# Patient Record
Sex: Male | Born: 1975 | Race: White | Hispanic: No | Marital: Single | Smoking: Current every day smoker
Health system: Southern US, Community
[De-identification: ages and names within clinical notes are randomized; demographics above are authoritative.]

---
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
## 2004-02-18 ENCOUNTER — Emergency Department (HOSPITAL_COMMUNITY): Admission: EM | Admit: 2004-02-18 | Discharge: 2004-02-19 | Payer: Self-pay | Admitting: Emergency Medicine

## 2005-01-24 ENCOUNTER — Emergency Department (HOSPITAL_COMMUNITY): Admission: EM | Admit: 2005-01-24 | Discharge: 2005-01-24 | Payer: Self-pay | Admitting: Emergency Medicine

## 2014-02-11 ENCOUNTER — Encounter (HOSPITAL_COMMUNITY): Payer: Self-pay | Admitting: Emergency Medicine

## 2014-02-11 ENCOUNTER — Emergency Department (HOSPITAL_COMMUNITY)
Admission: EM | Admit: 2014-02-11 | Discharge: 2014-02-12 | Disposition: A | Discharge status: Home / Self Care | Payer: BC Managed Care – PPO | Attending: Dermatology | Admitting: Dermatology

## 2014-02-11 ENCOUNTER — Emergency Department (HOSPITAL_COMMUNITY): Payer: BC Managed Care – PPO

## 2014-02-11 DIAGNOSIS — F17200 Nicotine dependence, unspecified, uncomplicated: Secondary | ICD-10-CM | POA: Insufficient documentation

## 2014-02-11 DIAGNOSIS — R1013 Epigastric pain: Secondary | ICD-10-CM | POA: Insufficient documentation

## 2014-02-11 DIAGNOSIS — R109 Unspecified abdominal pain: Secondary | ICD-10-CM

## 2014-02-11 LAB — COMPREHENSIVE METABOLIC PANEL
ALBUMIN: 4.3 g/dL (ref 3.5–5.2)
ALT: 12 U/L (ref 0–53)
AST: 16 U/L (ref 0–37)
Alkaline Phosphatase: 71 U/L (ref 39–117)
BILIRUBIN TOTAL: 0.5 mg/dL (ref 0.3–1.2)
BUN: 9 mg/dL (ref 6–23)
CALCIUM: 9.8 mg/dL (ref 8.4–10.5)
CO2: 20 meq/L (ref 19–32)
CREATININE: 0.83 mg/dL (ref 0.50–1.35)
Chloride: 101 mEq/L (ref 96–112)
GFR calc Af Amer: >90 mL/min (ref >90)
GFR calc non Af Amer: >90 mL/min (ref >90)
GLUCOSE: 110 mg/dL — AB (ref 70–99)
POTASSIUM: 3.8 meq/L (ref 3.7–5.3)
Sodium: 137 mEq/L (ref 137–147)
TOTAL PROTEIN: 7.8 g/dL (ref 6.0–8.3)

## 2014-02-11 LAB — CBC
HCT: 41.8 % (ref 39.0–52.0)
Hemoglobin: 15.3 g/dL (ref 13.0–17.0)
MCH: 33.3 pg (ref 26.0–34.0)
MCHC: 36.6 g/dL — ABNORMAL HIGH (ref 30.0–36.0)
MCV: 90.9 fL (ref 78.0–100.0)
Platelets: 313 10*3/uL (ref 150–400)
RBC: 4.6 MIL/uL (ref 4.22–5.81)
RDW: 12.7 % (ref 11.5–15.5)
WBC: 21.6 10*3/uL — AB (ref 4.0–10.5)

## 2014-02-11 LAB — LIPASE, BLOOD: Lipase: 18 U/L (ref 11–59)

## 2014-02-11 MED ORDER — IOHEXOL 300 MG/ML  SOLN
100.0000 mL | Freq: Once | INTRAMUSCULAR | Status: AC | PRN
  Administered 2014-02-11: 100 mL via INTRAVENOUS

## 2014-02-11 MED ORDER — PANTOPRAZOLE SODIUM 40 MG PO TBEC: 40.0000 mg | DELAYED_RELEASE_TABLET | Freq: Every day | ORAL | Status: AC

## 2014-02-11 MED ORDER — HYDROCODONE-ACETAMINOPHEN 5-325 MG PO TABS
2.0000 | ORAL_TABLET | Freq: Once | ORAL | Status: AC
  Administered 2014-02-11: 2 via ORAL
  Filled 2014-02-11: qty 2

## 2014-02-11 MED ORDER — GI COCKTAIL ~~LOC~~
30.0000 mL | Freq: Once | ORAL | Status: AC
  Administered 2014-02-11: 30 mL via ORAL
  Filled 2014-02-11: qty 30

## 2014-02-11 MED ORDER — IOHEXOL 300 MG/ML  SOLN
50.0000 mL | Freq: Once | INTRAMUSCULAR | Status: AC | PRN
  Administered 2014-02-11: 50 mL via ORAL

## 2014-02-11 MED ORDER — FAMOTIDINE 20 MG PO TABS
20.0000 mg | ORAL_TABLET | Freq: Once | ORAL | Status: AC
  Administered 2014-02-11: 20 mg via ORAL
  Filled 2014-02-11: qty 1

## 2014-02-11 NOTE — Discharge Instructions (Signed)
Rest. Drink plenty of fluids. Take protonix (acid blocker medication).   You may also try maalox or mylanta as need for symptom relief. Follow up with GI doctor in the coming week - see referral - call office tomorrow to arrange appointment. Return to ER if worse, new symptoms, fevers, persistent vomiting, severe pain, other concern.  You were given pain medication in the ER - no driving for the next 4 hours.      Abdominal Pain, Adult Many things can cause abdominal pain. Usually, abdominal pain is not caused by a disease and will improve without treatment. It can often be observed and treated at home. Your health care provider will do a physical exam and possibly order blood tests and X-rays to help determine the seriousness of your pain. However, in many cases, more time must pass before a clear cause of the pain can be found. Before that point, your health care provider may not know if you need more testing or further treatment. HOME CARE INSTRUCTIONS  Monitor your abdominal pain for any changes. The following actions may help to alleviate any discomfort you are experiencing:  Only take over-the-counter or prescription medicines as directed by your health care provider.  Do not take laxatives unless directed to do so by your health care provider.  Try a clear liquid diet (broth, tea, or water) as directed by your health care provider. Slowly move to a bland diet as tolerated. SEEK MEDICAL CARE IF:  You have unexplained abdominal pain.  You have abdominal pain associated with nausea or diarrhea.  You have pain when you urinate or have a bowel movement.  You experience abdominal pain that wakes you in the night.  You have abdominal pain that is worsened or improved by eating food.  You have abdominal pain that is worsened with eating fatty foods. SEEK IMMEDIATE MEDICAL CARE IF:   Your pain does not go away within 2 hours.  You have a fever.  You keep throwing up  (vomiting).  Your pain is felt only in portions of the abdomen, such as the right side or the left lower portion of the abdomen.  You pass bloody or black tarry stools. MAKE SURE YOU:  Understand these instructions.   Will watch your condition.   Will get help right away if you are not doing well or get worse.  Document Released: 08/09/2005 Document Revised: 08/20/2013 Document Reviewed: 07/09/2013 [facility] Patient Information 2014 [place], [place].    Gastritis, Adult Gastritis is soreness and swelling (inflammation) of the lining of the stomach. Gastritis can develop as a sudden onset (acute) or long-term (chronic) condition. If gastritis is not treated, it can lead to stomach bleeding and ulcers. CAUSES  Gastritis occurs when the stomach lining is weak or damaged. Digestive juices from the stomach then inflame the weakened stomach lining. The stomach lining may be weak or damaged due to viral or bacterial infections. One common bacterial infection is the Helicobacter pylori infection. Gastritis can also result from excessive alcohol consumption, taking certain medicines, or having too much acid in the stomach.  SYMPTOMS  In some cases, there are no symptoms. When symptoms are present, they may include:  Pain or a burning sensation in the upper abdomen.  Nausea.  Vomiting.  An uncomfortable feeling of fullness after eating. DIAGNOSIS  Your caregiver may suspect you have gastritis based on your symptoms and a physical exam. To determine the cause of your gastritis, your caregiver may perform the following:  Blood or  stool tests to check for the H pylori bacterium.  Gastroscopy. A thin, flexible tube (endoscope) is passed down the esophagus and into the stomach. The endoscope has a light and camera on the end. Your caregiver uses the endoscope to view the inside of the stomach.  Taking a tissue sample (biopsy) from the stomach to examine under a microscope. TREATMENT   Depending on the cause of your gastritis, medicines may be prescribed. If you have a bacterial infection, such as an H pylori infection, antibiotics may be given. If your gastritis is caused by too much acid in the stomach, H2 blockers or antacids may be given. Your caregiver may recommend that you stop taking aspirin, ibuprofen, or other nonsteroidal anti-inflammatory drugs (NSAIDs). HOME CARE INSTRUCTIONS  Only take over-the-counter or prescription medicines as directed by your caregiver.  If you were given antibiotic medicines, take them as directed. Finish them even if you start to feel better.  Drink enough fluids to keep your urine clear or pale yellow.  Avoid foods and drinks that make your symptoms worse, such as:  Caffeine or alcoholic drinks.  Chocolate.  Peppermint or mint flavorings.  Garlic and onions.  Spicy foods.  Citrus fruits, such as oranges, lemons, or limes.  Tomato-based foods such as sauce, chili, salsa, and pizza.  Fried and fatty foods.  Eat small, frequent meals instead of large meals. SEEK IMMEDIATE MEDICAL CARE IF:   You have black or dark red stools.  You vomit blood or material that looks like coffee grounds.  You are unable to keep fluids down.  Your abdominal pain gets worse.  You have a fever.  You do not feel better after 1 week.  You have any other questions or concerns. MAKE SURE YOU:  Understand these instructions.  Will watch your condition.  Will get help right away if you are not doing well or get worse. Document Released: 10/24/2001 Document Revised: 04/30/2012 Document Reviewed: 12/13/2011 [facility] Patient Information 2014 [place], [place].

## 2014-02-11 NOTE — ED Provider Notes (Signed)
CSN: [id number]     Arrival date & time 02/11/14  2152 History   First MD Initiated Contact with Patient 02/11/14 2213     Chief Complaint  Patient presents with  . Abdominal Pain     (Consider location/radiation/quality/duration/timing/severity/associated sxs/prior Treatment) Patient is a 38 y.o. male presenting with abdominal pain. The history is provided by the patient.  Abdominal Pain Associated symptoms: no chest pain, no chills, no constipation, no cough, no diarrhea, no fever, no shortness of breath, no sore throat and no vomiting   pt c/o epigastric pain, initially 1 month ago, states was seen by doctor then, and told possibly gastritis/pud, and was given gi meds w relief of symptoms for a couple days. States in past month since, return of similar pain. Epigastric area, dull, moderate, non radiating. States not clearly better or worse w eating. No back pain. No previous hx pud, pancreatitis or gallstones. No abd distension. No prior abd surgery. Continues to have normal bms including today. Nausea. No vomiting. Denies cp or discomfort. No sob. No cough or uri c/o. No fever or chills. No abd trauma/injury. No wt loss. Denies etoh abuse.      History reviewed. No pertinent past medical history. Past Surgical History  Procedure Laterality Date  . Knee surgery  1996   No family history on file. History  Substance Use Topics  . Smoking status: Current Every Day Smoker -- 1.00 packs/day    Types: Cigarettes  . Smokeless tobacco: Not on file  . Alcohol Use: 2.4 oz/week    4 Cans of beer per week    Review of Systems  Constitutional: Negative for fever and chills.  HENT: Negative for sore throat.   Eyes: Negative for redness.  Respiratory: Negative for cough and shortness of breath.   Cardiovascular: Negative for chest pain.  Gastrointestinal: Positive for abdominal pain. Negative for vomiting, diarrhea, constipation and abdominal distention.  Genitourinary: Negative for flank  pain.  Musculoskeletal: Negative for back pain and neck pain.  Skin: Negative for rash.  Neurological: Negative for headaches.  Hematological: Does not bruise/bleed easily.  Psychiatric/Behavioral: Negative for confusion.      Allergies  Review of patient's allergies indicates no known allergies.  Home Medications  No current outpatient prescriptions on file. BP 124/82  Pulse 77  Temp(Src) 98.6 F (37 C) (Oral)  Resp 22  Wt 135 lb (61.236 kg)  SpO2 98% Physical Exam  Nursing note and vitals reviewed. Constitutional: He is oriented to person, place, and time. He appears well-developed and well-nourished. No distress.  HENT:  Mouth/Throat: Oropharynx is clear and moist.  Eyes: Conjunctivae are normal. No scleral icterus.  Neck: Neck supple. No tracheal deviation present.  Cardiovascular: Normal rate, regular rhythm, normal heart sounds and intact distal pulses.  Exam reveals no gallop and no friction rub.   No murmur heard. Pulmonary/Chest: Effort normal and breath sounds normal. No accessory muscle usage. No respiratory distress.  Abdominal: Soft. Bowel sounds are normal. He exhibits no distension and no mass. There is tenderness. There is no rebound and no guarding.  Mild epigastric tenderness, no rebound or guarding.   Genitourinary:  No cva tenderness  Musculoskeletal: Normal range of motion. He exhibits no edema and no tenderness.  Neurological: He is alert and oriented to person, place, and time.  Skin: Skin is warm and dry. He is not diaphoretic.  Psychiatric: He has a normal mood and affect.    ED Course  Procedures (including critical care time)  Results for orders placed during the hospital encounter of 02/11/14  CBC      Result Value Ref Range   WBC 21.6 (*) 4.0 - 10.5 K/uL   RBC 4.60  4.22 - 5.81 MIL/uL   Hemoglobin 15.3  13.0 - 17.0 g/dL   HCT 16.141.8  09.639.0 - 04.552.0 %   MCV 90.9  78.0 - 100.0 fL   MCH 33.3  26.0 - 34.0 pg   MCHC 36.6 (*) 30.0 - 36.0 g/dL    RDW 40.912.7  81.111.5 - 91.415.5 %   Platelets 313  150 - 400 K/uL  COMPREHENSIVE METABOLIC PANEL      Result Value Ref Range   Sodium 137  137 - 147 mEq/L   Potassium 3.8  3.7 - 5.3 mEq/L   Chloride 101  96 - 112 mEq/L   CO2 20  19 - 32 mEq/L   Glucose, Bld 110 (*) 70 - 99 mg/dL   BUN 9  6 - 23 mg/dL   Creatinine, Ser 7.820.83  0.50 - 1.35 mg/dL   Calcium 9.8  8.4 - 95.610.5 mg/dL   Total Protein 7.8  6.0 - 8.3 g/dL   Albumin 4.3  3.5 - 5.2 g/dL   AST 16  0 - 37 U/L   ALT 12  0 - 53 U/L   Alkaline Phosphatase 71  39 - 117 U/L   Total Bilirubin 0.5  0.3 - 1.2 mg/dL   GFR calc non Af Amer >90  >90 mL/min   GFR calc Af Amer >90  >90 mL/min  LIPASE, BLOOD      Result Value Ref Range   Lipase 18  11 - 59 U/L       MDM  Labs. Pt states got ride to ED, does not have to drive home. Pepcid, gi cocktail and hydrocodone for symptom relief.   Reviewed nursing notes and prior charts for additional history.   Recheck pt - mid abd tenderness on exam. Wbc 21. Will get ct.   2330, ct pending - signed out to Dr Dierdre Highmanpitz to follow up w ct.  If negative, and pt improved, may d/c w acid blocker rx and gi follow up.  If positive/abn, dispo appropriately.      Suzi RootsKevin E Demarie Hyneman, MD 02/11/14 [phone]

## 2014-02-11 NOTE — ED Notes (Signed)
Per pt report: pt was seen at UC 2 weeks ago and tx stomach ulcers and hemmorrids. The medications did help for a few days but pain has come back and is worse than before.  Pt pain is in the right upper quadrant.  Pt reports having nausea. Pt a/o x 4. Skin warm and dry.  Pt ambulatory in triage.

## 2014-02-12 NOTE — ED Provider Notes (Signed)
12:59 AM PT evaluated - states he is feeling better, minimal discomfort after medications. ABD s/nt/nd, no acute ABD. Neg Murphys sign. Ct results shared with PT as below.   Plan d/c home, Rx PPI, GI referral, return precautions provided.    Ct Abdomen Pelvis W Contrast  02/12/2014   CLINICAL DATA:  Abdominal pain, weakness and nausea.  EXAM: CT ABDOMEN AND PELVIS WITH CONTRAST  TECHNIQUE: Multidetector CT imaging of the abdomen and pelvis was performed using the standard protocol following bolus administration of intravenous contrast.  CONTRAST:  50mL OMNIPAQUE IOHEXOL 300 MG/ML SOLN, 100mL OMNIPAQUE IOHEXOL 300 MG/ML SOLN  COMPARISON:  None.  FINDINGS: The visualized lung bases are clear.  The liver and spleen are unremarkable in appearance. The gallbladder is within normal limits. The pancreas and adrenal glands are unremarkable.  The kidneys are unremarkable in appearance. There is no evidence of hydronephrosis. No renal or ureteral stones are seen. No perinephric stranding is appreciated.  No free fluid is identified. The small bowel is unremarkable in appearance. The stomach is within normal limits. No acute vascular abnormalities are seen.  The appendix is not definitely seen; there is no evidence for appendicitis. The colon is unremarkable in appearance.  The bladder is moderately distended and grossly unremarkable in appearance. The prostate remains normal in size. No inguinal lymphadenopathy is seen.  No acute osseous abnormalities are identified.  IMPRESSION: Unremarkable contrast-enhanced CT of the abdomen and pelvis.   Electronically Signed   By: Roanna RaiderJeffery  Chang M.D.   On: 02/12/2014 00:05      Sunnie NielsenBrian Quinlynn Cuthbert, MD 02/12/14 0100

## 2014-02-12 NOTE — ED Notes (Signed)
Pt's ride is in the waiting room.

## 2014-02-19 ENCOUNTER — Encounter: Payer: Self-pay | Admitting: Gastroenterology

## 2014-02-26 ENCOUNTER — Other Ambulatory Visit: Payer: Self-pay | Admitting: Gastroenterology

## 2014-02-26 DIAGNOSIS — R1011 Right upper quadrant pain: Secondary | ICD-10-CM

## 2014-02-26 DIAGNOSIS — R1013 Epigastric pain: Secondary | ICD-10-CM

## 2014-03-10 ENCOUNTER — Ambulatory Visit (HOSPITAL_COMMUNITY)
Admission: RE | Admit: 2014-03-10 | Discharge: 2014-03-10 | Disposition: A | Discharge status: Home / Self Care | Payer: BC Managed Care – PPO | Source: Ambulatory Visit | Attending: Gastroenterology | Admitting: Gastroenterology

## 2014-03-10 DIAGNOSIS — R1011 Right upper quadrant pain: Secondary | ICD-10-CM

## 2014-03-10 DIAGNOSIS — R1013 Epigastric pain: Secondary | ICD-10-CM

## 2014-03-10 DIAGNOSIS — R110 Nausea: Secondary | ICD-10-CM | POA: Insufficient documentation

## 2014-03-10 MED ORDER — TECHNETIUM TC 99M MEBROFENIN IV KIT
5.0000 | PACK | Freq: Once | INTRAVENOUS | Status: AC | PRN
  Administered 2014-03-10: 5 via INTRAVENOUS

## 2014-04-10 ENCOUNTER — Ambulatory Visit: Payer: BC Managed Care – PPO | Admitting: Gastroenterology

## 2018-12-24 ENCOUNTER — Emergency Department (HOSPITAL_COMMUNITY): Payer: Self-pay

## 2018-12-24 ENCOUNTER — Encounter (HOSPITAL_COMMUNITY): Payer: Self-pay

## 2018-12-24 ENCOUNTER — Emergency Department (HOSPITAL_COMMUNITY)
Admission: EM | Admit: 2018-12-24 | Discharge: 2018-12-24 | Disposition: A | Discharge status: Home / Self Care | Payer: Self-pay | Attending: Emergency Medicine | Admitting: Emergency Medicine

## 2018-12-24 DIAGNOSIS — S0990XA Unspecified injury of head, initial encounter: Secondary | ICD-10-CM | POA: Insufficient documentation

## 2018-12-24 DIAGNOSIS — Y9289 Other specified places as the place of occurrence of the external cause: Secondary | ICD-10-CM | POA: Insufficient documentation

## 2018-12-24 DIAGNOSIS — Z79899 Other long term (current) drug therapy: Secondary | ICD-10-CM | POA: Insufficient documentation

## 2018-12-24 DIAGNOSIS — Y9389 Activity, other specified: Secondary | ICD-10-CM | POA: Insufficient documentation

## 2018-12-24 DIAGNOSIS — Y929 Unspecified place or not applicable: Secondary | ICD-10-CM | POA: Insufficient documentation

## 2018-12-24 DIAGNOSIS — Y999 Unspecified external cause status: Secondary | ICD-10-CM | POA: Insufficient documentation

## 2018-12-24 DIAGNOSIS — F17210 Nicotine dependence, cigarettes, uncomplicated: Secondary | ICD-10-CM | POA: Insufficient documentation

## 2018-12-24 MED ORDER — IBUPROFEN 400 MG PO TABS
600.0000 mg | ORAL_TABLET | Freq: Once | ORAL | Status: AC
  Administered 2018-12-24: 600 mg via ORAL
  Filled 2018-12-24: qty 1
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
## 2018-12-24 NOTE — Discharge Instructions (Addendum)
Please return for any problem.  Follow-up with your regular care provider as instructed.   ° °Take ibuprofen --600 mg taken orally every 8 hours -for pain. ° °  °
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
